# Patient Record
Sex: Female | Born: 1990 | Race: Black or African American | Hispanic: No | Marital: Single | Smoking: Former smoker
Health system: Southern US, Community
[De-identification: ages and names within clinical notes are randomized; demographics above are authoritative.]

## PROBLEM LIST (undated history)

## (undated) ENCOUNTER — Ambulatory Visit (HOSPITAL_COMMUNITY)
Discharge status: Absent without leave | Payer: PRIVATE HEALTH INSURANCE | Attending: Family Medicine | Admitting: Family Medicine

---
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
## 2014-04-27 ENCOUNTER — Encounter (HOSPITAL_COMMUNITY): Payer: Self-pay | Admitting: Emergency Medicine

## 2014-04-27 ENCOUNTER — Emergency Department (HOSPITAL_COMMUNITY)
Admission: EM | Admit: 2014-04-27 | Discharge: 2014-04-27 | Disposition: A | Discharge status: Home / Self Care | Payer: BC Managed Care – PPO | Attending: Emergency Medicine | Admitting: Emergency Medicine

## 2014-04-27 DIAGNOSIS — N390 Urinary tract infection, site not specified: Secondary | ICD-10-CM | POA: Insufficient documentation

## 2014-04-27 DIAGNOSIS — Z3202 Encounter for pregnancy test, result negative: Secondary | ICD-10-CM | POA: Insufficient documentation

## 2014-04-27 DIAGNOSIS — F17200 Nicotine dependence, unspecified, uncomplicated: Secondary | ICD-10-CM | POA: Insufficient documentation

## 2014-04-27 DIAGNOSIS — R110 Nausea: Secondary | ICD-10-CM | POA: Insufficient documentation

## 2014-04-27 LAB — URINALYSIS, ROUTINE W REFLEX MICROSCOPIC
BILIRUBIN URINE: NEGATIVE
Glucose, UA: NEGATIVE mg/dL
Hgb urine dipstick: NEGATIVE
KETONES UR: NEGATIVE mg/dL
Nitrite: POSITIVE — AB
PH: 7 (ref 5.0–8.0)
Protein, ur: NEGATIVE mg/dL
Specific Gravity, Urine: 1.025 (ref 1.005–1.030)
Urobilinogen, UA: 1 mg/dL (ref 0.0–1.0)

## 2014-04-27 LAB — URINE MICROSCOPIC-ADD ON

## 2014-04-27 LAB — PREGNANCY, URINE: Preg Test, Ur: NEGATIVE

## 2014-04-27 MED ORDER — CEPHALEXIN 500 MG PO CAPS: 500.0000 mg | ORAL_CAPSULE | Freq: Four times a day (QID) | ORAL | Status: DC

## 2014-04-27 NOTE — Discharge Instructions (Signed)
Take antibiotic to completion for your urinary tract infection.  Urinary Tract Infection Urinary tract infections (UTIs) can develop anywhere along your urinary tract. Your urinary tract is your body's drainage system for removing wastes and extra water. Your urinary tract includes two kidneys, two ureters, a bladder, and a urethra. Your kidneys are a pair of bean-shaped organs. Each kidney is about the size of your fist. They are located below your ribs, one on each side of your spine. CAUSES Infections are caused by microbes, which are microscopic organisms, including fungi, viruses, and bacteria. These organisms are so small that they can only be seen through a microscope. Bacteria are the microbes that most commonly cause UTIs. SYMPTOMS  Symptoms of UTIs may vary by age and gender of the patient and by the location of the infection. Symptoms in young women typically include a frequent and intense urge to urinate and a painful, burning feeling in the bladder or urethra during urination. Older women and men are more likely to be tired, shaky, and weak and have muscle aches and abdominal pain. A fever may mean the infection is in your kidneys. Other symptoms of a kidney infection include pain in your back or sides below the ribs, nausea, and vomiting. DIAGNOSIS To diagnose a UTI, your caregiver will ask you about your symptoms. Your caregiver also will ask to provide a urine sample. The urine sample will be tested for bacteria and white blood cells. White blood cells are made by your body to help fight infection. TREATMENT  Typically, UTIs can be treated with medication. Because most UTIs are caused by a bacterial infection, they usually can be treated with the use of antibiotics. The choice of antibiotic and length of treatment depend on your symptoms and the type of bacteria causing your infection. HOME CARE INSTRUCTIONS  If you were prescribed antibiotics, take them exactly as your caregiver  instructs you. Finish the medication even if you feel better after you have only taken some of the medication.  Drink enough water and fluids to keep your urine clear or pale yellow.  Avoid caffeine, tea, and carbonated beverages. They tend to irritate your bladder.  Empty your bladder often. Avoid holding urine for long periods of time.  Empty your bladder before and after sexual intercourse.  After a bowel movement, women should cleanse from front to back. Use each tissue only once. SEEK MEDICAL CARE IF:   You have back pain.  You develop a fever.  Your symptoms do not begin to resolve within 3 days. SEEK IMMEDIATE MEDICAL CARE IF:   You have severe back pain or lower abdominal pain.  You develop chills.  You have nausea or vomiting.  You have continued burning or discomfort with urination. MAKE SURE YOU:   Understand these instructions.  Will watch your condition.  Will get help right away if you are not doing well or get worse. Document Released: 08/31/2005 Document Revised: 05/22/2012 Document Reviewed: 12/30/2011 [facility] Patient Information 2014 [place], [place].

## 2014-04-27 NOTE — ED Notes (Addendum)
Pt A+ox4, reports foul smelling urine x2 months, has not seen a doctor. Reports taking "something over the counter to clear it out".  Pt reports onset today of R flank pain, 8/10.  Pt denies n/v/d/c.  Pt denies fevers/chills.  Skin PWD.  Ambulatory with steady gait.  NAD.

## 2014-04-27 NOTE — ED Provider Notes (Signed)
CSN: [id number]     Arrival date & time 04/27/14  2117 History   First MD Initiated Contact with Patient 04/27/14 2138     Chief Complaint  Patient presents with  . Flank Pain     (Consider location/radiation/quality/duration/timing/severity/associated sxs/prior Treatment) HPI Comments: 23 year old female presents to the emergency department complaining of foul-smelling urine x2 months. Patient states for the first month she had dysuria, foul-smelling urine, increased urinary frequency and urgency. She has been taking over-the-counter medications and drinking a lot of water with relief of the dysuria, frequency and urgency, however the bowel odor to her urine has remained. Denies vaginal discharge or bleeding. States she has suprapubic pain radiating to the right side of her back which developed over the past couple of days. Admits to associated nausea earlier this morning which has since subsided. Denies fever, chills, vomiting, diarrhea, hematuria.  Patient is a 23 y.o. female presenting with flank pain. The history is provided by the patient.  Flank Pain Associated symptoms include abdominal pain and nausea.    History reviewed. No pertinent past medical history. History reviewed. No pertinent past surgical history. No family history on file. History  Substance Use Topics  . Smoking status: Current Some Day Smoker  . Smokeless tobacco: Never Used  . Alcohol Use: Yes     Comment: occassionally   OB History   Grav Para Term Preterm Abortions TAB SAB Ect Mult Living                 Review of Systems  Gastrointestinal: Positive for nausea and abdominal pain.  Genitourinary: Positive for flank pain.       Positive for foul smelling urine.  All other systems reviewed and are negative.     Allergies  Review of patient's allergies indicates no known allergies.  Home Medications   Prior to Admission medications   Not on File   BP 123/79  Pulse 79  Temp(Src) 98.8 F (37.1  C)  Resp 16  SpO2 100%  LMP 04/15/2014 Physical Exam  Nursing note and vitals reviewed. Constitutional: She is oriented to person, place, and time. She appears well-developed and well-nourished. No distress.  HENT:  Head: Normocephalic and atraumatic.  Mouth/Throat: Oropharynx is clear and moist.  Eyes: Conjunctivae are normal.  Neck: Normal range of motion. Neck supple.  Cardiovascular: Normal rate, regular rhythm and normal heart sounds.   Pulmonary/Chest: Effort normal and breath sounds normal.  Abdominal: Soft. Bowel sounds are normal. There is tenderness (mild, discomfort) in the suprapubic area. There is no rigidity, no rebound, no guarding and no CVA tenderness.  No peritoneal signs.  Musculoskeletal: Normal range of motion. She exhibits no edema.  Neurological: She is alert and oriented to person, place, and time.  Skin: Skin is warm and dry. She is not diaphoretic.  Psychiatric: She has a normal mood and affect. Her behavior is normal.    ED Course  Procedures (including critical care time) Labs Review Labs Reviewed  URINALYSIS, ROUTINE W REFLEX MICROSCOPIC - Abnormal; Notable for the following:    Color, Urine ORANGE (*)    APPearance CLOUDY (*)    Nitrite POSITIVE (*)    Leukocytes, UA SMALL (*)    All other components within normal limits  URINE MICROSCOPIC-ADD ON - Abnormal; Notable for the following:    Bacteria, UA MANY (*)    All other components within normal limits  PREGNANCY, URINE    Imaging Review No results found.   EKG Interpretation None  MDM   Final diagnoses:  UTI (urinary tract infection)   Pt presenting with 2 months of foul odor urine she is well appearing and in NAD. Afebrile. Mild discomfort in suprapubic area. UA nitrite positive, many bacteria. Will treat with keflex. Stable for d/c. Return precautions given. Patient states understanding of treatment care plan and is agreeable.   Trevor Mace, PA-C 04/27/14 2233

## 2014-04-27 NOTE — ED Provider Notes (Signed)
Medical screening examination/treatment/procedure(s) were performed by non-physician practitioner and as supervising physician I was immediately available for consultation/collaboration.   EKG Interpretation None        Lyanne Co, MD 04/27/14 2242

## 2016-07-27 ENCOUNTER — Encounter (HOSPITAL_COMMUNITY): Payer: Self-pay | Admitting: Emergency Medicine

## 2016-07-27 ENCOUNTER — Emergency Department (HOSPITAL_COMMUNITY): Payer: BLUE CROSS/BLUE SHIELD

## 2016-07-27 ENCOUNTER — Emergency Department (HOSPITAL_COMMUNITY)
Admission: EM | Admit: 2016-07-27 | Discharge: 2016-07-27 | Disposition: A | Discharge status: Home / Self Care | Payer: BLUE CROSS/BLUE SHIELD | Attending: Emergency Medicine | Admitting: Emergency Medicine

## 2016-07-27 DIAGNOSIS — K219 Gastro-esophageal reflux disease without esophagitis: Secondary | ICD-10-CM | POA: Insufficient documentation

## 2016-07-27 DIAGNOSIS — R05 Cough: Secondary | ICD-10-CM | POA: Diagnosis present

## 2016-07-27 DIAGNOSIS — IMO0001 Reserved for inherently not codable concepts without codable children: Secondary | ICD-10-CM

## 2016-07-27 DIAGNOSIS — R059 Cough, unspecified: Secondary | ICD-10-CM

## 2016-07-27 DIAGNOSIS — F17200 Nicotine dependence, unspecified, uncomplicated: Secondary | ICD-10-CM | POA: Diagnosis not present

## 2016-07-27 MED ORDER — OMEPRAZOLE 20 MG PO CPDR: 20.0000 mg | DELAYED_RELEASE_CAPSULE | Freq: Every day | ORAL | 0 refills | Status: DC

## 2016-07-27 MED ORDER — GI COCKTAIL ~~LOC~~
30.0000 mL | Freq: Once | ORAL | Status: AC
  Administered 2016-07-27: 30 mL via ORAL
  Filled 2016-07-27: qty 30

## 2016-07-27 NOTE — ED Provider Notes (Signed)
[facility]-EMERGENCY DEPT Provider Note   CSN: [id number] Arrival date & time: 07/27/16  0152     History   Chief Complaint Chief Complaint  Patient presents with  . Cough    dry throat    HPI Karina Gray is a 25 y.o. female.  Patient presents with persistent, recurring cough for the past one month. No fever, chest pain, vomiting, congestion. She is a smoker, no history of asthma. She does not feel short of breath or have pleuritic pain.    The history is provided by the patient. No language interpreter was used.  Cough  This is a new problem. The current episode started more than 1 week ago. The problem occurs every few minutes. The cough is non-productive. Pertinent negatives include no chest pain, no rhinorrhea and no sore throat.    History reviewed. No pertinent past medical history.  There are no active problems to display for this patient.   History reviewed. No pertinent surgical history.  OB History    No data available       Home Medications    Prior to Admission medications   Medication Sig Start Date End Date Taking? Authorizing Provider  acetaminophen (TYLENOL) 500 MG tablet Take 500 mg by mouth every 6 (six) hours as needed (headache).    Historical Provider, MD  cephALEXin (KEFLEX) 500 MG capsule Take 1 capsule (500 mg total) by mouth 4 (four) times daily. 04/27/14   Kathrynn Speedobyn M Hess, PA-C    Family History No family history on file.  Social History Social History  Substance Use Topics  . Smoking status: Current Some Day Smoker  . Smokeless tobacco: Never Used  . Alcohol use Yes     Comment: occassionally     Allergies   Review of patient's allergies indicates no known allergies.   Review of Systems Review of Systems  Constitutional: Negative for fever.  HENT: Negative for congestion, rhinorrhea, sinus pressure and sore throat.   Respiratory: Positive for cough.   Cardiovascular: Negative for chest pain.  Gastrointestinal: Negative for  abdominal pain and nausea.  Musculoskeletal: Negative for back pain.     Physical Exam Updated Vital Signs BP 115/78 (BP Location: Left Arm)   Pulse 96   Temp 98 F (36.7 C) (Oral)   Resp 18   Ht 4\' 11"  (1.499 m)   Wt 83.9 kg   LMP 06/26/2016   SpO2 100%   BMI 37.37 kg/m   Physical Exam  Constitutional: She appears well-developed and well-nourished.  HENT:  Head: Normocephalic.  Neck: Normal range of motion. Neck supple.  Cardiovascular: Normal rate and regular rhythm.   Pulmonary/Chest: Effort normal and breath sounds normal.  Abdominal: Soft. Bowel sounds are normal. There is no tenderness. There is no rebound and no guarding.  Musculoskeletal: Normal range of motion.  Neurological: She is alert. No cranial nerve deficit.  Skin: Skin is warm and dry. No rash noted.  Psychiatric: She has a normal mood and affect.     ED Treatments / Results  Labs (all labs ordered are listed, but only abnormal results are displayed) Labs Reviewed - No data to display  EKG  EKG Interpretation None       Radiology Dg Chest 2 View  Result Date: 07/27/2016 CLINICAL DATA:  25 y/o F; chronic cough with chest congestion for 1 month. EXAM: CHEST  2 VIEW COMPARISON:  None. FINDINGS: Peribronchial thickening probably represents bronchitis. No consolidation, pneumothorax, or pleural effusion. No acute osseous abnormality. IMPRESSION:  Peribronchial thickening probably represents bronchitis. No consolidation, pneumothorax, or pleural effusion. Electronically Signed   By: Mitzi HansenLance  Furusawa-Stratton M.D.   On: 07/27/2016 02:41    Procedures Procedures (including critical care time)  Medications Ordered in ED Medications  gi cocktail (Maalox,Lidocaine,Donnatal) (not administered)     Initial Impression / Assessment and Plan / ED Course  I have reviewed the triage vital signs and the nursing notes.  Pertinent labs & imaging results that were available during my care of the patient were  reviewed by me and considered in my medical decision making (see chart for details).  Clinical Course    Patient presents for persistent, recurrent dry cough x 1 month. CXR clear, afebrile, no SOB. She admits to intermittent dyspepsia. Suspect cough is related to this rather than pulmonary cause. GI cocktail provided with perceived improvement in symptoms of cough. Will refer to PCP, start on Prilosec.   Final Clinical Impressions(s) / ED Diagnoses   Final diagnoses:  None   1. Cough 2. Reflux  New Prescriptions New Prescriptions   No medications on file     Elpidio AnisShari Maureen Duesing, PA-C 07/27/16 [id number]    Layla MawKristen N Ward, DO 07/27/16 [phone]

## 2016-07-27 NOTE — ED Triage Notes (Signed)
Pt. reports chronic dry cough , throat irritation with mild chest congestion , denies fever or chills. Respirations unlabored .

## 2016-09-11 ENCOUNTER — Encounter (HOSPITAL_COMMUNITY): Payer: Self-pay | Admitting: *Deleted

## 2016-09-11 ENCOUNTER — Emergency Department (HOSPITAL_COMMUNITY)
Admission: EM | Admit: 2016-09-11 | Discharge: 2016-09-11 | Disposition: A | Discharge status: Home / Self Care | Payer: BLUE CROSS/BLUE SHIELD | Attending: Emergency Medicine | Admitting: Emergency Medicine

## 2016-09-11 ENCOUNTER — Emergency Department (HOSPITAL_COMMUNITY): Payer: BLUE CROSS/BLUE SHIELD

## 2016-09-11 DIAGNOSIS — Y92410 Unspecified street and highway as the place of occurrence of the external cause: Secondary | ICD-10-CM | POA: Diagnosis not present

## 2016-09-11 DIAGNOSIS — Y999 Unspecified external cause status: Secondary | ICD-10-CM | POA: Diagnosis not present

## 2016-09-11 DIAGNOSIS — F17200 Nicotine dependence, unspecified, uncomplicated: Secondary | ICD-10-CM | POA: Insufficient documentation

## 2016-09-11 DIAGNOSIS — Y939 Activity, unspecified: Secondary | ICD-10-CM | POA: Insufficient documentation

## 2016-09-11 DIAGNOSIS — T2017XA Burn of first degree of neck, initial encounter: Secondary | ICD-10-CM | POA: Insufficient documentation

## 2016-09-11 DIAGNOSIS — S0990XA Unspecified injury of head, initial encounter: Secondary | ICD-10-CM | POA: Diagnosis present

## 2016-09-11 DIAGNOSIS — S0181XA Laceration without foreign body of other part of head, initial encounter: Secondary | ICD-10-CM | POA: Diagnosis not present

## 2016-09-11 MED ORDER — CYCLOBENZAPRINE HCL 10 MG PO TABS: 5.0000 mg | ORAL_TABLET | Freq: Two times a day (BID) | ORAL | 0 refills | Status: DC | PRN

## 2016-09-11 MED ORDER — IBUPROFEN 600 MG PO TABS: 600.0000 mg | ORAL_TABLET | Freq: Four times a day (QID) | ORAL | 0 refills | Status: DC | PRN

## 2016-09-11 MED ORDER — SILVER SULFADIAZINE 1 % EX CREA: 1.0000 "application " | TOPICAL_CREAM | Freq: Every day | CUTANEOUS | 0 refills | Status: DC
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
## 2016-09-11 NOTE — ED Notes (Signed)
See pa assessment 

## 2016-09-11 NOTE — ED Notes (Signed)
Patient transported to CT at this time via ED stretcher. Pt in no apparent distress at this time.   
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
## 2019-03-28 ENCOUNTER — Encounter (HOSPITAL_COMMUNITY): Payer: Self-pay

## 2019-03-28 ENCOUNTER — Emergency Department (HOSPITAL_COMMUNITY)
Admission: EM | Admit: 2019-03-28 | Discharge: 2019-03-29 | Disposition: A | Discharge status: Home / Self Care | Payer: BLUE CROSS/BLUE SHIELD | Attending: Emergency Medicine | Admitting: Emergency Medicine

## 2019-03-28 ENCOUNTER — Other Ambulatory Visit: Payer: Self-pay

## 2019-03-28 DIAGNOSIS — B373 Candidiasis of vulva and vagina: Secondary | ICD-10-CM | POA: Insufficient documentation

## 2019-03-28 DIAGNOSIS — B3731 Acute candidiasis of vulva and vagina: Secondary | ICD-10-CM

## 2019-03-28 DIAGNOSIS — F17210 Nicotine dependence, cigarettes, uncomplicated: Secondary | ICD-10-CM | POA: Insufficient documentation

## 2019-03-28 DIAGNOSIS — L292 Pruritus vulvae: Secondary | ICD-10-CM | POA: Diagnosis present

## 2019-03-28 NOTE — ED Provider Notes (Signed)
[facility]   CSN: [id number] Arrival date & time: 03/28/19  2249    History   Chief Complaint Chief Complaint  Patient presents with  . Vaginal Itching    HPI Karina Gray is a 28 y.o. female.     Patient presents to the ED with a chief complaint of vaginal itching and discharge.  She states that it has been ongoing for the past 3 days.  She states that she has also had some burning with urination.  She is afraid she has a yeast infection.  She states that she believes she has an appointment with her OBGYN coming up.  The history is provided by the patient. No language interpreter was used.    History reviewed. No pertinent past medical history.  There are no active problems to display for this patient.   Past Surgical History:  Procedure Laterality Date  . KNEE SURGERY Bilateral 2006     OB History   No obstetric history on file.      Home Medications    Prior to Admission medications   Medication Sig Start Date End Date Taking? Authorizing Provider  acetaminophen (TYLENOL) 500 MG tablet Take 500 mg by mouth every 6 (six) hours as needed (headache).    [provider]  cephALEXin (KEFLEX) 500 MG capsule Take 1 capsule (500 mg total) by mouth 4 (four) times daily. 04/27/14   Hess, Nada Boozerobyn M, PA-C  cyclobenzaprine (FLEXERIL) 10 MG tablet Take 0.5-1 tablets (5-10 mg total) by mouth 2 (two) times daily as needed. 09/11/16   Marlon PelGreene, Tiffany, PA-C  ibuprofen (ADVIL,MOTRIN) 600 MG tablet Take 1 tablet (600 mg total) by mouth every 6 (six) hours as needed. 09/11/16   Marlon PelGreene, Tiffany, PA-C  omeprazole (PRILOSEC) 20 MG capsule Take 1 capsule (20 mg total) by mouth daily. 07/27/16   Elpidio AnisUpstill, Shari, PA-C  silver sulfADIAZINE (SILVADENE) 1 % cream Apply 1 application topically daily. 09/11/16   Marlon PelGreene, Tiffany, PA-C    Family History No family history on file.  Social History Social History   Tobacco Use  .  Smoking status: Current Some Day Smoker  . Smokeless tobacco: Never Used  Substance Use Topics  . Alcohol use: Yes    Comment: occassionally  . Drug use: Not on file     Allergies   Patient has no known allergies.   Review of Systems Review of Systems  All other systems reviewed and are negative.    Physical Exam Updated Vital Signs BP (!) 143/83   Pulse 99   Temp 98.8 F (37.1 C) (Oral)   Resp 18   SpO2 99%   Physical Exam Vitals signs and nursing note reviewed.  Constitutional:      General: She is not in acute distress.    Appearance: She is well-developed.  HENT:     Head: Normocephalic and atraumatic.  Eyes:     Conjunctiva/sclera: Conjunctivae normal.  Neck:     Musculoskeletal: Neck supple.  Cardiovascular:     Rate and Rhythm: Normal rate and regular rhythm.     Heart sounds: No murmur.  Pulmonary:     Effort: Pulmonary effort is normal. No respiratory distress.     Breath sounds: Normal breath sounds.  Abdominal:     Palpations: Abdomen is soft.     Tenderness: There is no abdominal tenderness.  Skin:    General: Skin is warm and dry.  Neurological:     Mental Status: She  is alert.      ED Treatments / Results  Labs (all labs ordered are listed, but only abnormal results are displayed) Labs Reviewed - No data to display  EKG None  Radiology No results found.  Procedures Procedures (including critical care time)  Medications Ordered in ED Medications - No data to display   Initial Impression / Assessment and Plan / ED Course  I have reviewed the triage vital signs and the nursing notes.  Pertinent labs & imaging results that were available during my care of the patient were reviewed by me and considered in my medical decision making (see chart for details).        Patient with vaginal discharge.  No abdominal pain.  Has some burning and itching.  Self swab shows yeast cells.  UA inconsistent with UTI. preg negative.  Final  Clinical Impressions(s) / ED Diagnoses   Final diagnoses:  Yeast vaginitis    ED Discharge Orders         Ordered    fluconazole (DIFLUCAN) 150 MG tablet     03/29/19 0033           Roxy Horseman, PA-C 03/29/19 0404    Mesner, Barbara Cower, MD 03/29/19 [phone]

## 2019-03-28 NOTE — ED Triage Notes (Signed)
Pt reports vaginal irritation and burning with urination for the past 3 days. Also reports some white discharge

## 2019-03-29 LAB — GC/CHLAMYDIA PROBE AMP (~~LOC~~) NOT AT ARMC
Chlamydia: NEGATIVE
Neisseria Gonorrhea: NEGATIVE

## 2019-03-29 LAB — URINALYSIS, ROUTINE W REFLEX MICROSCOPIC
Bacteria, UA: NONE SEEN
Bilirubin Urine: NEGATIVE
Glucose, UA: NEGATIVE mg/dL
Ketones, ur: NEGATIVE mg/dL
Leukocytes,Ua: NEGATIVE
Nitrite: NEGATIVE
Protein, ur: NEGATIVE mg/dL
Specific Gravity, Urine: 1.028 (ref 1.005–1.030)
pH: 5 (ref 5.0–8.0)

## 2019-03-29 LAB — WET PREP, GENITAL
Clue Cells Wet Prep HPF POC: NONE SEEN
Sperm: NONE SEEN
Trich, Wet Prep: NONE SEEN

## 2019-03-29 LAB — PREGNANCY, URINE: Preg Test, Ur: NEGATIVE

## 2019-03-29 MED ORDER — FLUCONAZOLE 150 MG PO TABS
150.0000 mg | ORAL_TABLET | Freq: Once | ORAL | Status: AC
  Administered 2019-03-29: 150 mg via ORAL
  Filled 2019-03-29: qty 1

## 2019-03-29 MED ORDER — FLUCONAZOLE 150 MG PO TABS: ORAL_TABLET | ORAL | 0 refills | Status: DC

## 2019-03-29 NOTE — ED Notes (Signed)
Patient verbalizes understanding of discharge instructions. Opportunity for questioning and answers were provided. Armband removed by staff, pt discharged from ED.  

## 2019-11-20 ENCOUNTER — Ambulatory Visit (HOSPITAL_COMMUNITY)
Admission: EM | Admit: 2019-11-20 | Discharge: 2019-11-20 | Disposition: A | Discharge status: Home / Self Care | Payer: Self-pay | Attending: Family Medicine | Admitting: Family Medicine

## 2019-11-20 ENCOUNTER — Encounter (HOSPITAL_COMMUNITY): Payer: Self-pay

## 2019-11-20 DIAGNOSIS — N898 Other specified noninflammatory disorders of vagina: Secondary | ICD-10-CM | POA: Insufficient documentation

## 2019-11-20 LAB — POCT URINALYSIS DIP (DEVICE)
Bilirubin Urine: NEGATIVE
Glucose, UA: NEGATIVE mg/dL
Ketones, ur: NEGATIVE mg/dL
Nitrite: NEGATIVE
Protein, ur: NEGATIVE mg/dL
Specific Gravity, Urine: >=1.03 (ref 1.005–1.030)
Urobilinogen, UA: 0.2 mg/dL (ref 0.0–1.0)
pH: 7 (ref 5.0–8.0)

## 2019-11-20 MED ORDER — CEFTRIAXONE SODIUM 250 MG IJ SOLR
250.0000 mg | Freq: Once | INTRAMUSCULAR | Status: AC
  Administered 2019-11-20: 250 mg via INTRAMUSCULAR

## 2019-11-20 MED ORDER — AZITHROMYCIN 250 MG PO TABS
ORAL_TABLET | ORAL | Status: AC
  Filled 2019-11-20: qty 4

## 2019-11-20 MED ORDER — AZITHROMYCIN 250 MG PO TABS
1000.0000 mg | ORAL_TABLET | Freq: Once | ORAL | Status: AC
  Administered 2019-11-20: 1000 mg via ORAL

## 2019-11-20 MED ORDER — FLUCONAZOLE 150 MG PO TABS: ORAL_TABLET | ORAL | 0 refills | Status: DC

## 2019-11-20 MED ORDER — CEFTRIAXONE SODIUM 250 MG IJ SOLR
INTRAMUSCULAR | Status: AC
  Filled 2019-11-20: qty 250

## 2019-11-20 MED ORDER — LIDOCAINE HCL (PF) 1 % IJ SOLN
INTRAMUSCULAR | Status: AC
  Filled 2019-11-20: qty 2

## 2019-11-20 NOTE — ED Triage Notes (Signed)
Pt presents with vaginal itchiness and burning when urinating x 2 days.

## 2019-11-20 NOTE — ED Provider Notes (Signed)
[facility]   [id number] 11/20/19 Arrival Time: 9381  ASSESSMENT & PLAN:  1. Vaginal itching     No s/s of PID.  Meds ordered this encounter  Medications  . fluconazole (DIFLUCAN) 150 MG tablet    Sig: Take one tablet by mouth as a single dose. May repeat in 3 days if symptoms persist.    Dispense:  2 tablet    Refill:  0  . azithromycin (ZITHROMAX) tablet 1,000 mg  . cefTRIAXone (ROCEPHIN) injection 250 mg      Discharge Instructions     You have been given the following medications today for treatment of suspected gonorrhea and/or chlamydia:  cefTRIAXone (ROCEPHIN) injection 250 mg azithromycin (ZITHROMAX) tablet 1,000 mg  Even though we have treated you today, we have sent testing for sexually transmitted infections as well as a urine culture. We will notify you of any positive results once they are received. If required, we will prescribe any medications you might need.  Please refrain from all sexual activity for at least the next seven days.     Pending: Labs Reviewed  URINE CULTURE  CERVICOVAGINAL ANCILLARY ONLY    Will notify of any positive results. Instructed to refrain from sexual activity for at least seven days.  Reviewed expectations re: course of current medical issues. Questions answered. Outlined signs and symptoms indicating need for more acute intervention. Patient verbalized understanding. After Visit Summary given.   SUBJECTIVE:  Karina Gray is a 28 y.o. female who presents with complaint of vaginal itching/irritation. Onset gradual. First noticed approx 2 d ago. Questions slight vaginal discharge; "see a few spots". No specific aggravating or alleviating factors reported. Denies: urinary frequency and gross hematuria. No specific dysuria. Afebrile. No abdominal or pelvic pain. Normal PO intake wihout n/v. No genital rashes or lesions. Reports that she is sexually active with single female partner. OTC treatment:  none. History of STI: none reported.  No LMP recorded (within weeks).  ROS: As per HPI. All other systems negative.   OBJECTIVE:  Vitals:   11/20/19 1256  BP: 114/78  Pulse: 86  Resp: 16  Temp: 98.9 F (37.2 C)  TempSrc: Oral  SpO2: 100%     General appearance: alert, cooperative, appears stated age and no distress Throat: lips, mucosa, and tongue normal; teeth and gums normal CV: RRR Lungs: CTAB Back: no CVA tenderness; FROM at waist Abdomen: soft, non-tender GU: deferred Skin: warm and dry Psychological: alert and cooperative; normal mood and affect.    Labs Reviewed  URINE CULTURE  CERVICOVAGINAL ANCILLARY ONLY    No Known Allergies   PMH: "Healthy" FH: HTN Social History   Socioeconomic History  . Marital status: Single    Spouse name: Not on file  . Number of children: Not on file  . Years of education: Not on file  . Highest education level: Not on file  Occupational History  . Not on file  Tobacco Use  . Smoking status: Current Some Day Smoker  . Smokeless tobacco: Never Used  Substance and Sexual Activity  . Alcohol use: Yes    Comment: occassionally  . Drug use: Not on file  . Sexual activity: Yes    Birth control/protection: Condom  Other Topics Concern  . Not on file  Social History Narrative  . Not on file   Social Determinants of Health   Financial Resource Strain:   . Difficulty of Paying Living Expenses: Not on file  Food Insecurity:   . Worried About  Running Out of Food in the Last Year: Not on file  . Ran Out of Food in the Last Year: Not on file  Transportation Needs:   . Lack of Transportation (Medical): Not on file  . Lack of Transportation (Non-Medical): Not on file  Physical Activity:   . Days of Exercise per Week: Not on file  . Minutes of Exercise per Session: Not on file  Stress:   . Feeling of Stress : Not on file  Social Connections:   . Frequency of Communication with Friends and Family: Not on file  .  Frequency of Social Gatherings with Friends and Family: Not on file  . Attends Religious Services: Not on file  . Active Member of Clubs or Organizations: Not on file  . Attends Banker Meetings: Not on file  . Marital Status: Not on file  Intimate Partner Violence:   . Fear of Current or Ex-Partner: Not on file  . Emotionally Abused: Not on file  . Physically Abused: Not on file  . Sexually Abused: Not on file          Mardella Layman, MD 11/20/19 [phone]

## 2019-11-20 NOTE — ED Notes (Signed)
Urine in lab, instructions for self swab discussed with patient

## 2019-11-20 NOTE — Discharge Instructions (Addendum)
You have been given the following medications today for treatment of suspected gonorrhea and/or chlamydia:  cefTRIAXone (ROCEPHIN) injection 250 mg azithromycin (ZITHROMAX) tablet 1,000 mg  Even though we have treated you today, we have sent testing for sexually transmitted infections as well as a urine culture. We will notify you of any positive results once they are received. If required, we will prescribe any medications you might need.  Please refrain from all sexual activity for at least the next seven days.

## 2019-11-21 LAB — URINE CULTURE: Culture: NO GROWTH

## 2019-11-22 LAB — CERVICOVAGINAL ANCILLARY ONLY
Bacterial vaginitis: NEGATIVE
Candida vaginitis: NEGATIVE
Chlamydia: NEGATIVE
Neisseria Gonorrhea: NEGATIVE
Trichomonas: NEGATIVE

## 2020-09-01 ENCOUNTER — Other Ambulatory Visit: Payer: Self-pay

## 2020-09-01 ENCOUNTER — Ambulatory Visit
Admission: EM | Admit: 2020-09-01 | Discharge: 2020-09-01 | Disposition: A | Discharge status: Home / Self Care | Payer: PRIVATE HEALTH INSURANCE | Attending: Physician Assistant | Admitting: Physician Assistant

## 2020-09-01 DIAGNOSIS — H0102A Squamous blepharitis right eye, upper and lower eyelids: Secondary | ICD-10-CM

## 2020-09-01 DIAGNOSIS — H1032 Unspecified acute conjunctivitis, left eye: Secondary | ICD-10-CM | POA: Diagnosis not present

## 2020-09-01 MED ORDER — OFLOXACIN 0.3 % OP SOLN: 1.0000 [drp] | Freq: Four times a day (QID) | OPHTHALMIC | 0 refills | Status: AC

## 2020-09-01 NOTE — ED Provider Notes (Signed)
[facility]    CSN: [id number] Arrival date & time: 09/01/20  1626      History   Chief Complaint Chief Complaint  Patient presents with  . Eye Pain    HPI Karina Gray is a 29 y.o. female.   29 year old female comes in for 2 day history of left eye redness, irritation, swelling. This started as bilateral eye itching. Since then, left eyelids have had swelling/irritation. Has noticed drainage to the eye, though without crusting. Intermittent vision changes. Mild cough, rhinorrhea, sneezing for the past week. Denies fevers. No contact lens, glasses use.      History reviewed. No pertinent past medical history.  There are no problems to display for this patient.   Past Surgical History:  Procedure Laterality Date  . KNEE SURGERY Bilateral 2006    OB History   No obstetric history on file.      Home Medications    Prior to Admission medications   Medication Sig Start Date End Date Taking? Authorizing Provider  ofloxacin (OCUFLOX) 0.3 % ophthalmic solution Place 1 drop into the left eye 4 (four) times daily for 5 days. 09/01/20 09/06/20  Belinda Fisher, PA-C    Family History History reviewed. No pertinent family history.  Social History Social History   Tobacco Use  . Smoking status: Current Some Day Smoker  . Smokeless tobacco: Never Used  Substance Use Topics  . Alcohol use: Yes    Comment: occassionally  . Drug use: Yes    Types: Marijuana     Allergies   Patient has no known allergies.   Review of Systems Review of Systems  Reason unable to perform ROS: See HPI as above.     Physical Exam Triage Vital Signs ED Triage Vitals  Enc Vitals Group     BP 09/01/20 1842 113/79     Pulse Rate 09/01/20 1842 66     Resp 09/01/20 1842 16     Temp 09/01/20 1842 98.3 F (36.8 C)     Temp Source 09/01/20 1842 Oral     SpO2 09/01/20 1842 100 %     Weight --      Height --      Head Circumference --      Peak Flow --      Pain Score  09/01/20 1852 2     Pain Loc --      Pain Edu? --      Excl. in GC? --    No data found.  Updated Vital Signs BP 113/79 (BP Location: Left Arm)   Pulse 66   Temp 98.3 F (36.8 C) (Oral)   Resp 16   LMP 08/18/2020   SpO2 100%   Visual Acuity Right Eye Distance: 20/20 Left Eye Distance: 20/30 Bilateral Distance: 20/20  Right Eye Near:   Left Eye Near:    Bilateral Near:     Physical Exam Constitutional:      General: She is not in acute distress.    Appearance: She is well-developed. She is not ill-appearing, toxic-appearing or diaphoretic.  HENT:     Head: Normocephalic and atraumatic.  Eyes:     Pupils: Pupils are equal, round, and reactive to light.     Comments: Left upper eyelid swelling without erythema, warmth. No tenderness to palpation. Left conjunctival injection. Right conjunctiva clear. EOMi. PERRL  Neurological:     Mental Status: She is alert and oriented to person, place, and time.  UC Treatments / Results  Labs (all labs ordered are listed, but only abnormal results are displayed) Labs Reviewed - No data to display  EKG   Radiology No results found.  Procedures Procedures (including critical care time)  Medications Ordered in UC Medications - No data to display  Initial Impression / Assessment and Plan / UC Course  I have reviewed the triage vital signs and the nursing notes.  Pertinent labs & imaging results that were available during my care of the patient were reviewed by me and considered in my medical decision making (see chart for details).    Start ofloxacin drops as directed. Lid scrubs and warm compresses as directed. Patient to follow up with ophthalmology if symptoms worsens or does not improve. Return precautions given.   Final Clinical Impressions(s) / UC Diagnoses   Final diagnoses:  Acute conjunctivitis of left eye, unspecified acute conjunctivitis type  Squamous blepharitis of upper and lower eyelids of both eyes      ED Prescriptions    Medication Sig Dispense Auth. Provider   ofloxacin (OCUFLOX) 0.3 % ophthalmic solution Place 1 drop into the left eye 4 (four) times daily for 5 days. 1 mL Belinda Fisher, PA-C     PDMP not reviewed this encounter.   Belinda Fisher, PA-C 09/01/20 1921

## 2020-09-01 NOTE — ED Triage Notes (Signed)
Pt c/o lt eye redness, swelling, irritation, and drainage since yesterday.

## 2020-09-01 NOTE — Discharge Instructions (Signed)
Use ofloxacin eyedrops as directed on left eye to cover for pink eye. Lid scrubs and warm compresses as directed. Monitor for any worsening of symptoms, changes in vision, sensitivity to light, eye swelling, painful eye movement, follow up with ophthalmology for further evaluation.

## 2021-03-22 ENCOUNTER — Emergency Department (HOSPITAL_COMMUNITY)
Admission: EM | Admit: 2021-03-22 | Discharge: 2021-03-23 | Disposition: A | Discharge status: Home / Self Care | Payer: Self-pay | Attending: Emergency Medicine | Admitting: Emergency Medicine

## 2021-03-22 ENCOUNTER — Other Ambulatory Visit: Payer: Self-pay

## 2021-03-22 ENCOUNTER — Encounter (HOSPITAL_COMMUNITY): Payer: Self-pay

## 2021-03-22 ENCOUNTER — Emergency Department (HOSPITAL_COMMUNITY): Payer: Self-pay

## 2021-03-22 DIAGNOSIS — D649 Anemia, unspecified: Secondary | ICD-10-CM

## 2021-03-22 DIAGNOSIS — J1089 Influenza due to other identified influenza virus with other manifestations: Secondary | ICD-10-CM | POA: Insufficient documentation

## 2021-03-22 DIAGNOSIS — J111 Influenza due to unidentified influenza virus with other respiratory manifestations: Secondary | ICD-10-CM

## 2021-03-22 DIAGNOSIS — Z20822 Contact with and (suspected) exposure to covid-19: Secondary | ICD-10-CM | POA: Insufficient documentation

## 2021-03-22 DIAGNOSIS — F17200 Nicotine dependence, unspecified, uncomplicated: Secondary | ICD-10-CM | POA: Insufficient documentation

## 2021-03-22 LAB — CBC WITH DIFFERENTIAL/PLATELET
Abs Immature Granulocytes: 0.02 10*3/uL (ref 0.00–0.07)
Basophils Absolute: 0 10*3/uL (ref 0.0–0.1)
Basophils Relative: 1 %
Eosinophils Absolute: 0.1 10*3/uL (ref 0.0–0.5)
Eosinophils Relative: 1 %
HCT: 31.6 % — ABNORMAL LOW (ref 36.0–46.0)
Hemoglobin: 9.6 g/dL — ABNORMAL LOW (ref 12.0–15.0)
Immature Granulocytes: 0 %
Lymphocytes Relative: 6 %
Lymphs Abs: 0.3 10*3/uL — ABNORMAL LOW (ref 0.7–4.0)
MCH: 21.8 pg — ABNORMAL LOW (ref 26.0–34.0)
MCHC: 30.4 g/dL (ref 30.0–36.0)
MCV: 71.8 fL — ABNORMAL LOW (ref 80.0–100.0)
Monocytes Absolute: 0.8 10*3/uL (ref 0.1–1.0)
Monocytes Relative: 14 %
Neutro Abs: 4.5 10*3/uL (ref 1.7–7.7)
Neutrophils Relative %: 78 %
Platelets: 406 10*3/uL — ABNORMAL HIGH (ref 150–400)
RBC: 4.4 MIL/uL (ref 3.87–5.11)
RDW: 20 % — ABNORMAL HIGH (ref 11.5–15.5)
WBC: 5.7 10*3/uL (ref 4.0–10.5)
nRBC: 0 % (ref 0.0–0.2)

## 2021-03-22 LAB — RESP PANEL BY RT-PCR (FLU A&B, COVID) ARPGX2
Influenza A by PCR: POSITIVE — AB
Influenza B by PCR: NEGATIVE
SARS Coronavirus 2 by RT PCR: NEGATIVE

## 2021-03-22 LAB — URINALYSIS, ROUTINE W REFLEX MICROSCOPIC
Bilirubin Urine: NEGATIVE
Glucose, UA: NEGATIVE mg/dL
Ketones, ur: 5 mg/dL — AB
Leukocytes,Ua: NEGATIVE
Nitrite: NEGATIVE
Protein, ur: NEGATIVE mg/dL
Specific Gravity, Urine: 1.004 — ABNORMAL LOW (ref 1.005–1.030)
pH: 6 (ref 5.0–8.0)

## 2021-03-22 LAB — COMPREHENSIVE METABOLIC PANEL
ALT: 12 U/L (ref 0–44)
AST: 17 U/L (ref 15–41)
Albumin: 4 g/dL (ref 3.5–5.0)
Alkaline Phosphatase: 75 U/L (ref 38–126)
Anion gap: 8 (ref 5–15)
BUN: 7 mg/dL (ref 6–20)
CO2: 23 mmol/L (ref 22–32)
Calcium: 8.8 mg/dL — ABNORMAL LOW (ref 8.9–10.3)
Chloride: 109 mmol/L (ref 98–111)
Creatinine, Ser: 0.7 mg/dL (ref 0.44–1.00)
GFR, Estimated: >60 mL/min (ref >60)
Glucose, Bld: 84 mg/dL (ref 70–99)
Potassium: 3.3 mmol/L — ABNORMAL LOW (ref 3.5–5.1)
Sodium: 140 mmol/L (ref 135–145)
Total Bilirubin: 0.5 mg/dL (ref 0.3–1.2)
Total Protein: 7.7 g/dL (ref 6.5–8.1)

## 2021-03-22 LAB — LIPASE, BLOOD: Lipase: 25 U/L (ref 11–51)

## 2021-03-22 LAB — HCG, QUANTITATIVE, PREGNANCY: hCG, Beta Chain, Quant, S: <1 m[IU]/mL (ref <5)

## 2021-03-22 MED ORDER — SODIUM CHLORIDE 0.9 % IV BOLUS (SEPSIS)
1000.0000 mL | Freq: Once | INTRAVENOUS | Status: AC
  Administered 2021-03-22: 1000 mL via INTRAVENOUS

## 2021-03-22 MED ORDER — SODIUM CHLORIDE 0.9 % IV SOLN
1000.0000 mL | INTRAVENOUS | Status: DC
  Administered 2021-03-22: 1000 mL via INTRAVENOUS

## 2021-03-22 MED ORDER — ACETAMINOPHEN 325 MG PO TABS
650.0000 mg | ORAL_TABLET | Freq: Once | ORAL | Status: AC | PRN
Start: 2021-03-22 — End: 2021-03-22
  Administered 2021-03-22: 650 mg via ORAL
  Filled 2021-03-22: qty 2

## 2021-03-22 NOTE — ED Provider Notes (Signed)
MSE was initiated and I personally evaluated the patient and placed orders (if any) at  7:10 PM on March 22, 2021.  The patient appears stable so that the remainder of the MSE may be completed by another provider.    Patient with cough vomiting and fever for the last 24 hours.  Patient is nontoxic with mild epigastric tenderness.  Labs and x-rays ordered   Bethann Berkshire, MD 03/22/21 1910

## 2021-03-22 NOTE — ED Triage Notes (Signed)
Patient c/o mid abdominal pain, emesis, and cough since this morning.

## 2021-03-22 NOTE — ED Provider Notes (Signed)
[facility]-EMERGENCY DEPT Provider Note   CSN: [id number] Arrival date & time: 03/22/21  1840     History Chief Complaint  Patient presents with  . Abdominal Pain  . Cough  . Emesis    Karina Gray is a 30 y.o. female.  HPI   Pt presents to the ED with complaints of cough, vomiting, fevers headache body aches.  Patient symptoms started within the last day.  She has been coughing and is having posttussive emesis.  She had headache but denies sore throat.  No abdominal pain.  No diarrhea.  Patient has been vaccinated for COVID  History reviewed. No pertinent past medical history.  There are no problems to display for this patient.   Past Surgical History:  Procedure Laterality Date  . KNEE SURGERY Bilateral 2006     OB History   No obstetric history on file.     Family History  Problem Relation Age of Onset  . Heart attack Mother   . Diabetes Father     Social History   Tobacco Use  . Smoking status: Current Some Day Smoker  . Smokeless tobacco: Never Used  Vaping Use  . Vaping Use: Never used  Substance Use Topics  . Alcohol use: Yes    Comment: occassionally  . Drug use: Yes    Types: Marijuana    Home Medications Prior to Admission medications   Medication Sig Start Date End Date Taking? Authorizing Provider  oseltamivir (TAMIFLU) 75 MG capsule Take 1 capsule (75 mg total) by mouth 2 (two) times daily. 03/23/21  Yes Linwood Dibbles, MD    Allergies    Patient has no known allergies.  Review of Systems   Review of Systems  All other systems reviewed and are negative.   Physical Exam Updated Vital Signs BP 108/71   Pulse 79   Temp 99.3 F (37.4 C) (Oral)   Resp 17   Ht 1.524 m (5')   Wt 69.6 kg   LMP 03/15/2021 Comment: neg preg test  SpO2 100%   BMI 29.96 kg/m   Physical Exam Vitals and nursing note reviewed.  Constitutional:      General: She is not in acute distress.    Appearance: She is well-developed.   HENT:     Head: Normocephalic and atraumatic.     Right Ear: External ear normal.     Left Ear: External ear normal.  Eyes:     General: No scleral icterus.       Right eye: No discharge.        Left eye: No discharge.     Conjunctiva/sclera: Conjunctivae normal.  Neck:     Trachea: No tracheal deviation.     Comments: Supple, no meningismus Cardiovascular:     Rate and Rhythm: Normal rate and regular rhythm.  Pulmonary:     Effort: Pulmonary effort is normal. No respiratory distress.     Breath sounds: Normal breath sounds. No stridor. No wheezing or rales.  Abdominal:     General: Bowel sounds are normal. There is no distension.     Palpations: Abdomen is soft.     Tenderness: There is no abdominal tenderness. There is no guarding or rebound.  Musculoskeletal:        General: No tenderness.     Cervical back: Neck supple. No rigidity.  Skin:    General: Skin is warm and dry.     Findings: No rash.  Neurological:  Mental Status: She is alert.     Cranial Nerves: No cranial nerve deficit (no facial droop, extraocular movements intact, no slurred speech).     Sensory: No sensory deficit.     Motor: No abnormal muscle tone or seizure activity.     Coordination: Coordination normal.     ED Results / Procedures / Treatments   Labs (all labs ordered are listed, but only abnormal results are displayed) Labs Reviewed  RESP PANEL BY RT-PCR (FLU A&B, COVID) ARPGX2 - Abnormal; Notable for the following components:      Result Value   Influenza A by PCR POSITIVE (*)    All other components within normal limits  CBC WITH DIFFERENTIAL/PLATELET - Abnormal; Notable for the following components:   Hemoglobin 9.6 (*)    HCT 31.6 (*)    MCV 71.8 (*)    MCH 21.8 (*)    RDW 20.0 (*)    Platelets 406 (*)    Lymphs Abs 0.3 (*)    All other components within normal limits  COMPREHENSIVE METABOLIC PANEL - Abnormal; Notable for the following components:   Potassium 3.3 (*)     Calcium 8.8 (*)    All other components within normal limits  URINALYSIS, ROUTINE W REFLEX MICROSCOPIC - Abnormal; Notable for the following components:   Color, Urine STRAW (*)    Specific Gravity, Urine 1.004 (*)    Hgb urine dipstick SMALL (*)    Ketones, ur 5 (*)    Bacteria, UA RARE (*)    All other components within normal limits  LIPASE, BLOOD  HCG, QUANTITATIVE, PREGNANCY    EKG None  Radiology DG Chest Portable 1 View  Result Date: 03/22/2021 CLINICAL DATA:  Cough and fever EXAM: PORTABLE CHEST 1 VIEW COMPARISON:  Chest x-ray 07/27/2016 FINDINGS: The heart size and mediastinal contours are within normal limits. No focal consolidation. No pulmonary edema. No pleural effusion. No pneumothorax. No acute osseous abnormality. IMPRESSION: No active disease. Electronically Signed   By: Tish Frederickson M.D.   On: 03/22/2021 23:57    Procedures Procedures   Medications Ordered in ED Medications  sodium chloride 0.9 % bolus 1,000 mL (0 mLs Intravenous Stopped 03/22/21 2220)    Followed by  0.9 %  sodium chloride infusion (1,000 mLs Intravenous New Bag/Given 03/22/21 2237)  acetaminophen (TYLENOL) tablet 650 mg (650 mg Oral Given 03/22/21 1902)    ED Course  I have reviewed the triage vital signs and the nursing notes.  Pertinent labs & imaging results that were available during my care of the patient were reviewed by me and considered in my medical decision making (see chart for details).  Clinical Course as of 03/23/21 0005  Mon Mar 22, 2021  2333 Pregnancy test is negative.  Urinalysis is negative. [JK]  2333 CBC is notable for anemia.  Low MCV suggests iron deficiency anemias.  Suspect this is chronic [JK]  2333 Patient's influenza test is positive [JK]  Tue Mar 23, 2021  0000 Chest x-ray negative for acute process [JK]    Clinical Course User Index [JK] Linwood Dibbles, MD   MDM Rules/Calculators/A&P                          Patient presents to the ED for evaluation of  fever body aches chills.  Patient did have a fever in the emergency room.  She was nontoxic-appearing.  No signs of pneumonia.  Urinalysis not signs of urinary tract  infection.  Patient's Influenza test however is positive.  This would certainly account for her symptoms.  Patient was given IV fluids.  Will discharge home with prescription for Tamiflu Final Clinical Impression(s) / ED Diagnoses Final diagnoses:  Influenza    Rx / DC Orders ED Discharge Orders         Ordered    oseltamivir (TAMIFLU) 75 MG capsule  2 times daily        03/23/21 0004           Linwood Dibbles, MD 03/25/21 [phone]

## 2021-03-23 MED ORDER — OSELTAMIVIR PHOSPHATE 75 MG PO CAPS: 75.0000 mg | ORAL_CAPSULE | Freq: Two times a day (BID) | ORAL | 0 refills | Status: DC

## 2021-03-23 NOTE — Discharge Instructions (Signed)
Drink plenty of fluids and rest.  Take Tylenol ibuprofen as needed for fever.  Take the Tamiflu as prescribed

## 2022-02-06 IMAGING — DX DG CHEST 1V PORT
1 series · 1 of 1 positions shown · non-contrast
Comparison: Chest x-ray 07/27/2016

CLINICAL DATA: Cough and fever

EXAM:
PORTABLE CHEST 1 VIEW

[chest ap]
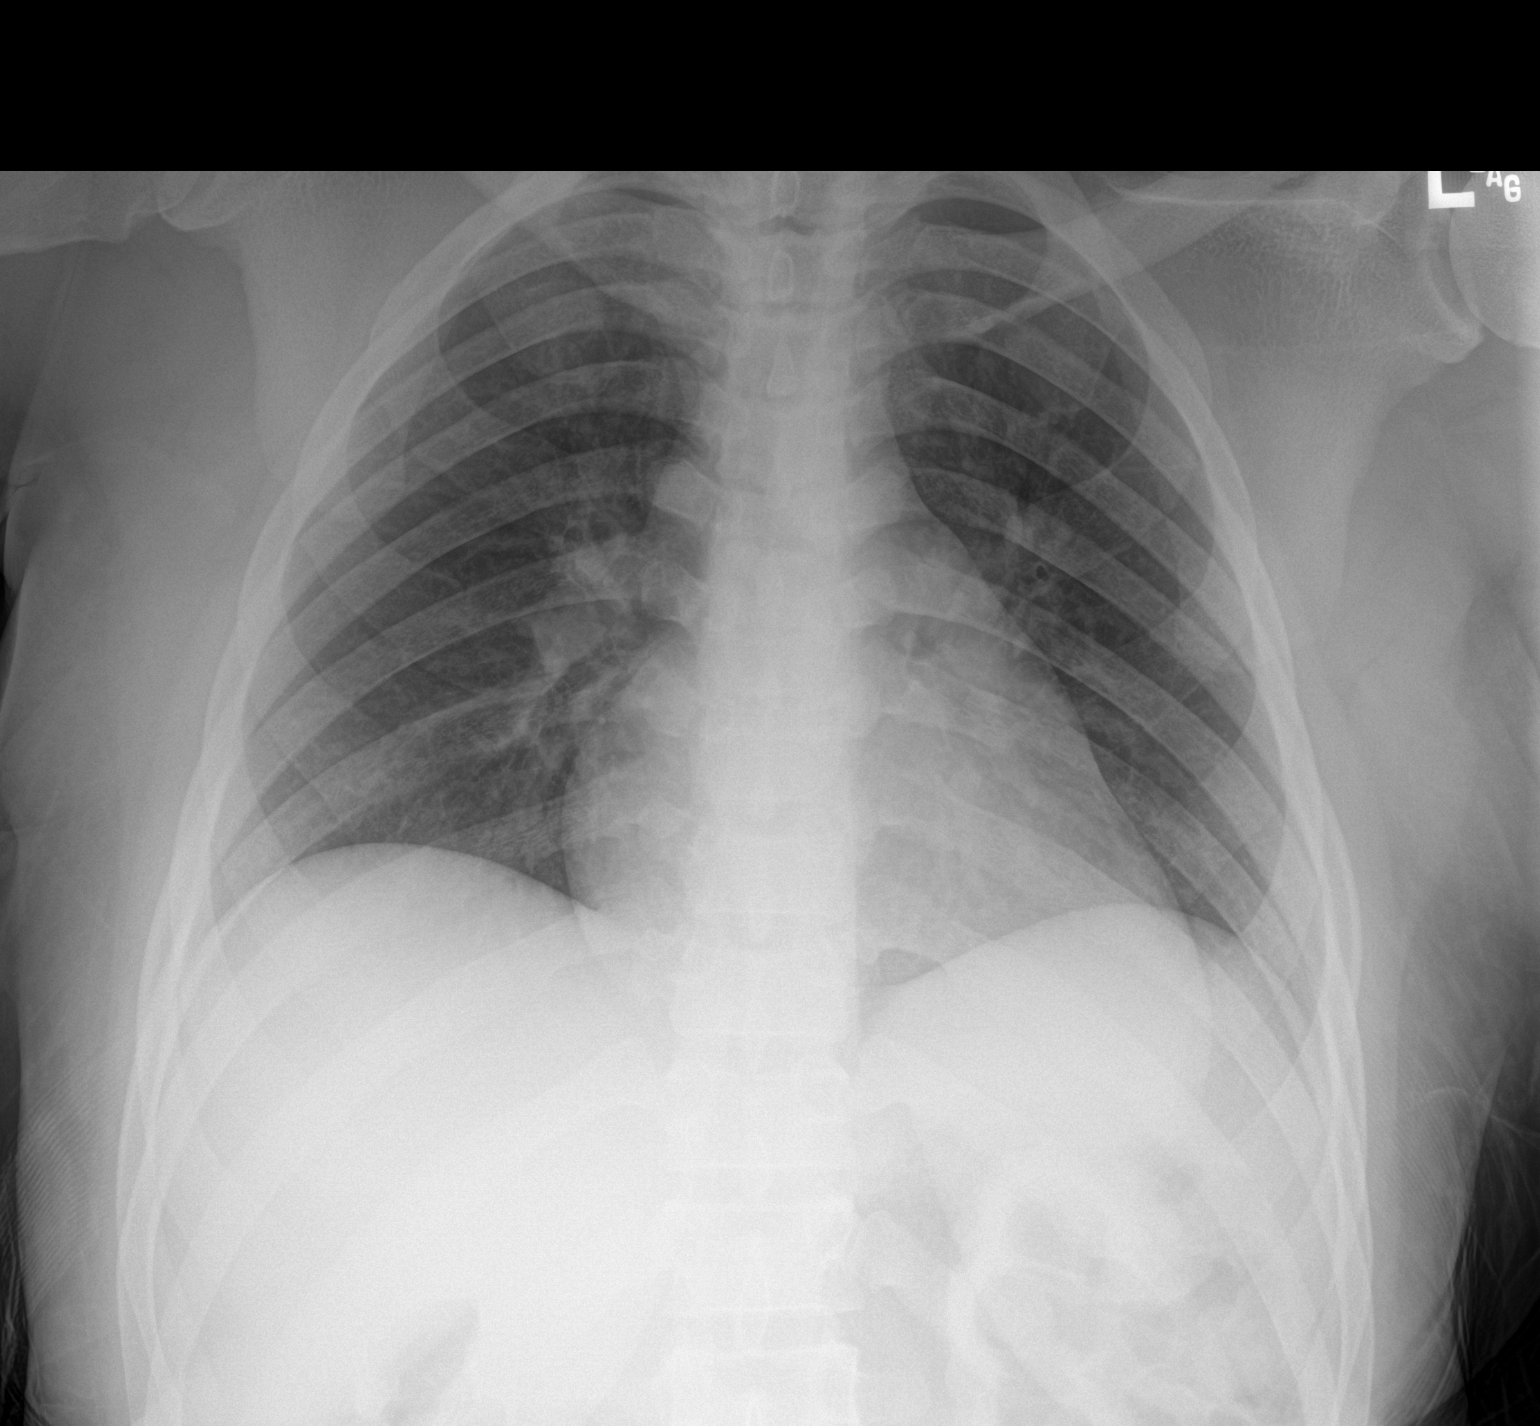

[1 of 1 positions shown; findings below may reference images not displayed]

FINDINGS: The heart size and mediastinal contours are within normal limits.

No focal consolidation. No pulmonary edema. No pleural effusion. No
pneumothorax.

No acute osseous abnormality.
IMPRESSION: No active disease.

## 2023-05-04 ENCOUNTER — Encounter: Payer: 59 | Admitting: Radiology

## 2023-05-09 ENCOUNTER — Ambulatory Visit (INDEPENDENT_AMBULATORY_CARE_PROVIDER_SITE_OTHER): Payer: 59 | Admitting: Radiology

## 2023-05-09 ENCOUNTER — Other Ambulatory Visit (HOSPITAL_COMMUNITY)
Admission: RE | Admit: 2023-05-09 | Discharge: 2023-05-09 | Disposition: A | Discharge status: Home / Self Care | Payer: 59 | Source: Ambulatory Visit | Attending: Radiology | Admitting: Radiology

## 2023-05-09 ENCOUNTER — Encounter: Payer: Self-pay | Admitting: Radiology

## 2023-05-09 VITALS — BP 102/68 | Ht 59.5 in | Wt 179.0 lb

## 2023-05-09 DIAGNOSIS — Z01419 Encounter for gynecological examination (general) (routine) without abnormal findings: Secondary | ICD-10-CM | POA: Diagnosis present

## 2023-05-09 DIAGNOSIS — Z113 Encounter for screening for infections with a predominantly sexual mode of transmission: Secondary | ICD-10-CM

## 2023-05-09 NOTE — Progress Notes (Signed)
   CESAR MUSACCHIA 08-05-1991 [id number]   History:  32 y.o. G0 presents for annual exam as a  new patient. C/o some tightness in her left axillae and increased vaginal fluids.Most recent period was 6 days late. No other gyn concerns.   Gynecologic History Patient's last menstrual period was 05/01/2023 (exact date). Period Duration (Days): 5 Period Pattern: (!) Irregular Menstrual Flow: Light, Moderate, Heavy Menstrual Control: Maxi pad, Thin pad Dysmenorrhea: (!) Moderate (moderate to severe) Dysmenorrhea Symptoms: Cramping Contraception/Family planning:  female partner Sexually active: yes Last Pap: 2020. Results were: normal   Obstetric History OB History  Gravida Para Term Preterm AB Living  0 0 0 0 0 0  SAB IAB Ectopic Multiple Live Births  0 0 0 0 0     The following portions of the patient's history were reviewed and updated as appropriate: allergies, current medications, past family history, past medical history, past social history, past surgical history, and problem list.  Review of Systems Pertinent items noted in HPI and remainder of comprehensive ROS otherwise negative.   Past medical history, past surgical history, family history and social history were all reviewed and documented in the EPIC chart.   Exam:  Vitals:   05/09/23 1408  BP: 102/68  Weight: 179 lb (81.2 kg)  Height: 4' 11.5" (1.511 m)   Body mass index is 35.55 kg/m.  General appearance:  Normal Thyroid:  Symmetrical, normal in size, without palpable masses or nodularity. Respiratory  Auscultation:  Clear without wheezing or rhonchi Cardiovascular  Auscultation:  Regular rate, without rubs, murmurs or gallops  Edema/varicosities:  Not grossly evident Abdominal  Soft,nontender, without masses, guarding or rebound.  Liver/spleen:  No organomegaly noted  Hernia:  None appreciated  Skin  Inspection:  Grossly normal Breasts: Examined lying and sitting.   Right: Without masses,  retractions, nipple discharge or axillary adenopathy.   Left: Without masses, retractions, nipple discharge or axillary adenopathy. Genitourinary   Inguinal/mons:  Normal without inguinal adenopathy  External genitalia:  Normal appearing vulva with no masses, tenderness, or lesions  BUS/Urethra/Skene's glands:  Normal without masses or exudate  Vagina:  Normal appearing with normal color and discharge, no lesions  Cervix:  Normal appearing without discharge or lesions  Uterus:  Normal in size, shape and contour.  Mobile, nontender  Adnexa/parametria:     Rt: Normal in size, without masses or tenderness.   Lt: Normal in size, without masses or tenderness.  Anus and perineum: Normal   Raynelle Fanning, CMA present for exam  Assessment/Plan:   1. Well woman exam with routine gynecological exam - Cytology - PAP( [place])  2. Screening for STDs (sexually transmitted diseases) - Cytology - PAP( [place])     Discussed SBE, colonoscopy and DEXA screening as directed/appropriate. Recommend of exercise weekly, including weight bearing exercise. Encouraged the use of seatbelts and sunscreen. Return in 1 year for annual or as needed.   Arlie Solomons B WHNP-BC 2:24 PM 05/09/2023

## 2023-05-18 LAB — CYTOLOGY - PAP
Chlamydia: NEGATIVE
Comment: NEGATIVE
Comment: NEGATIVE
Comment: NORMAL
Diagnosis: NEGATIVE
Neisseria Gonorrhea: NEGATIVE
Trichomonas: NEGATIVE

## 2023-12-29 ENCOUNTER — Ambulatory Visit (HOSPITAL_COMMUNITY)
Admission: EM | Admit: 2023-12-29 | Discharge: 2023-12-29 | Disposition: A | Discharge status: Home / Self Care | Payer: 59 | Attending: Emergency Medicine | Admitting: Emergency Medicine

## 2023-12-29 ENCOUNTER — Encounter (HOSPITAL_COMMUNITY): Payer: Self-pay | Admitting: Emergency Medicine

## 2023-12-29 DIAGNOSIS — K0500 Acute gingivitis, plaque induced: Secondary | ICD-10-CM | POA: Insufficient documentation

## 2023-12-29 LAB — CBC WITH DIFFERENTIAL/PLATELET
Abs Immature Granulocytes: 0.03 10*3/uL (ref 0.00–0.07)
Basophils Absolute: 0.1 10*3/uL (ref 0.0–0.1)
Basophils Relative: 1 %
Eosinophils Absolute: 0.2 10*3/uL (ref 0.0–0.5)
Eosinophils Relative: 3 %
HCT: 34.3 % — ABNORMAL LOW (ref 36.0–46.0)
Hemoglobin: 10.2 g/dL — ABNORMAL LOW (ref 12.0–15.0)
Immature Granulocytes: 0 %
Lymphocytes Relative: 22 %
Lymphs Abs: 2.1 10*3/uL (ref 0.7–4.0)
MCH: 22.1 pg — ABNORMAL LOW (ref 26.0–34.0)
MCHC: 29.7 g/dL — ABNORMAL LOW (ref 30.0–36.0)
MCV: 74.4 fL — ABNORMAL LOW (ref 80.0–100.0)
Monocytes Absolute: 1 10*3/uL (ref 0.1–1.0)
Monocytes Relative: 11 %
Neutro Abs: 5.9 10*3/uL (ref 1.7–7.7)
Neutrophils Relative %: 63 %
Platelets: 494 10*3/uL — ABNORMAL HIGH (ref 150–400)
RBC: 4.61 MIL/uL (ref 3.87–5.11)
RDW: 19.9 % — ABNORMAL HIGH (ref 11.5–15.5)
WBC: 9.3 10*3/uL (ref 4.0–10.5)
nRBC: 0 % (ref 0.0–0.2)

## 2023-12-29 LAB — IRON AND TIBC
Iron: 22 ug/dL — ABNORMAL LOW (ref 28–170)
Saturation Ratios: 4 % — ABNORMAL LOW (ref 10.4–31.8)
TIBC: 522 ug/dL — ABNORMAL HIGH (ref 250–450)
UIBC: 500 ug/dL

## 2023-12-29 LAB — VITAMIN B12: Vitamin B-12: 176 pg/mL — ABNORMAL LOW (ref 180–914)

## 2023-12-29 MED ORDER — LIDOCAINE VISCOUS HCL 2 % MT SOLN: 15.0000 mL | Freq: Four times a day (QID) | OROMUCOSAL | 0 refills | Status: AC | PRN

## 2023-12-29 MED ORDER — CLOTRIMAZOLE 10 MG MT TROC: 10.0000 mg | Freq: Every day | OROMUCOSAL | 0 refills | Status: AC

## 2023-12-29 NOTE — Discharge Instructions (Signed)
I have enclosed some information about gingivitis that I hope you find helpful.  Today we did blood work to check your red blood cell counts, iron level, and vitamin B12 level, all of which are indicators of anemia.  We will notify you of those results once we receive them and provide you with further recommendations, if any are needed.  For relief of your discomfort of your lower gums, I provided you with an oral solution that contains lidocaine that you can use every 6 hours as needed.  Swish it in your mouth for about 30 seconds with each use.  In addition to pain relief, I also recommend that you use clotrimazole oral solution 5 times daily to clear up any yeast that may be contributing to your discomfort as well.  Yeast is opportunistic and when you are severely anemic, sometimes you can develop yeast infection in your gums.  Please be sure you follow-up with your primary care provider regarding this visit and please consider working on finding a dentist for further evaluation of your gum disease.  Thank you for visiting [facility] today.

## 2023-12-29 NOTE — ED Provider Notes (Signed)
[facility]    CSN: [id number] Arrival date & time: 12/29/23  1719    HISTORY   Chief Complaint  Patient presents with   Dental Pain   HPI Karina Gray is a pleasant, 33 y.o. female who presents to urgent care today. Patient complains of a 2-day history of pain of her front lower gingiva.  Patient denies injury, sores or lesion in the area.  Patient states that the area is red and uncomfortable.  Patient states she has tried brushing her teeth aggressively, swishing liquor, several different mouthwashes and warm salt water with no relief.  Patient reports a history of anemia, not currently taking any iron supplements for this.  Patient states she does not currently have a primary care provider or dentist.  The history is provided by the patient.   History reviewed. No pertinent past medical history. There are no active problems to display for this patient.  Past Surgical History:  Procedure Laterality Date   KNEE SURGERY Bilateral 2006   OB History     Gravida  0   Para  0   Term  0   Preterm  0   AB  0   Living  0      SAB  0   IAB  0   Ectopic  0   Multiple  0   Live Births  0          Home Medications    Prior to Admission medications   Not on File    Family History Family History  Problem Relation Age of Onset   Heart attack Mother    Diabetes Father    Social History Social History   Tobacco Use   Smoking status: Former    Types: Cigarettes   Smokeless tobacco: Never  Vaping Use   Vaping status: Never Used  Substance Use Topics   Alcohol use: Yes    Comment: rare   Drug use: Yes    Types: Marijuana   Allergies   Patient has no known allergies.  Review of Systems Review of Systems Pertinent findings revealed after performing a 14 point review of systems has been noted in the history of present illness.  Physical Exam Vital Signs BP 122/85 (BP Location: Right Arm)   Pulse 63   Temp 98.6 F (37 C)  (Oral)   Resp 14   LMP 12/02/2023 (Approximate)   SpO2 100%   No data found.  Physical Exam Vitals and nursing note reviewed.  Constitutional:      General: She is not in acute distress.    Appearance: Normal appearance.  HENT:     Head: Normocephalic and atraumatic.     Mouth/Throat:     Dentition: Gingival swelling (Linear erythema) present. No dental caries or dental abscesses.  Eyes:     Pupils: Pupils are equal, round, and reactive to light.  Cardiovascular:     Rate and Rhythm: Normal rate and regular rhythm.  Pulmonary:     Effort: Pulmonary effort is normal.     Breath sounds: Normal breath sounds.  Musculoskeletal:        General: Normal range of motion.     Cervical back: Normal range of motion and neck supple.  Skin:    General: Skin is warm and dry.  Neurological:     General: No focal deficit present.     Mental Status: She is alert and oriented to person, place, and time. Mental status is at  baseline.  Psychiatric:        Mood and Affect: Mood normal.        Behavior: Behavior normal.        Thought Content: Thought content normal.        Judgment: Judgment normal.     Visual Acuity Right Eye Distance:   Left Eye Distance:   Bilateral Distance:    Right Eye Near:   Left Eye Near:    Bilateral Near:     UC Couse / Diagnostics / Procedures:     Radiology No results found.  Procedures Procedures (including critical care time) EKG  Pending results:  Labs Reviewed  CBC WITH DIFFERENTIAL/PLATELET  IRON AND TIBC  VITAMIN B12    Medications Ordered in UC: Medications - No data to display  UC Diagnoses / Final Clinical Impressions(s)   I have reviewed the triage vital signs and the nursing notes.  Pertinent labs & imaging results that were available during my care of the patient were reviewed by me and considered in my medical decision making (see chart for details).    Final diagnoses:  Acute gingivitis   Blood work was obtained to  evaluate patient's anemia, iron level and B12 level.  Will provide patient with results once received and provide recommendations as needed.  For pain relief, patient provided with viscous lidocaine.  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  Conservative care recommended.  Return precautions advised.  Please see discharge instructions below for details of plan of care as provided to patient. ED Prescriptions     Medication Sig Dispense Auth. Provider   clotrimazole (MYCELEX) 10 MG troche Take 1 tablet (10 mg total) by mouth 5 (five) times daily for 14 days. 70 tablet Theadora Rama Scales, PA-C   lidocaine (XYLOCAINE) 2 % solution Use as directed 15 mLs in the mouth or throat every 6 (six) hours as needed for up to 14 days for mouth pain (Sore throat). 840 mL Theadora Rama Scales, PA-C      PDMP not reviewed this encounter.  Pending results:  Labs Reviewed  CBC WITH DIFFERENTIAL/PLATELET  IRON AND TIBC  VITAMIN B12      Discharge Instructions      I have enclosed some information about gingivitis that I hope you find helpful.  Today we did blood work to check your red blood cell counts, iron level, and vitamin B12 level, all of which are indicators of anemia.  We will notify you of those results once we receive them and provide you with further recommendations, if any are needed.  For relief of your discomfort of your lower gums, I provided you with an oral solution that contains lidocaine that you can use every 6 hours as needed.  Swish it in your mouth for about 30 seconds with each use.  In addition to pain relief, I also recommend that you use clotrimazole oral solution 5 times daily to clear up any yeast that may be contributing to your discomfort as well.  Yeast is opportunistic and  when you are severely anemic, sometimes you can develop yeast infection in your gums.  Please be sure you follow-up with your primary care provider regarding this visit and please consider working on finding a dentist for further evaluation of your gum disease.  Thank you for visiting [facility] today.        Disposition Upon Discharge:  Condition: stable for discharge home  Patient presented with an acute illness with associated systemic symptoms and significant discomfort requiring urgent management. In my opinion, this is a condition that a prudent lay person (someone who possesses an average knowledge of health and medicine) may potentially expect to result in complications if not addressed urgently such as respiratory distress, impairment of bodily function or dysfunction of bodily organs.   Routine symptom specific, illness specific and/or disease specific instructions were discussed with the patient and/or caregiver at length.   As such, the patient has been evaluated and assessed, work-up was performed and treatment was provided in alignment with urgent care protocols and evidence based medicine.  Patient/parent/caregiver has been advised that the patient may require follow up for further testing and treatment if the symptoms continue in spite of treatment, as clinically indicated and appropriate.  Patient/parent/caregiver has been advised to return to the [facility] or PCP if no better; to PCP or the Emergency Department if new signs and symptoms develop, or if the current signs or symptoms continue to change or worsen for further workup, evaluation and treatment as clinically indicated and appropriate  The patient will follow up with their current PCP if and as advised. If the patient does not currently have a PCP we will assist them in obtaining one.   The patient may need specialty follow up if the symptoms continue, in spite of conservative treatment and management, for  further workup, evaluation, consultation and treatment as clinically indicated and appropriate.  Patient/parent/caregiver verbalized understanding and agreement of plan as discussed.  All questions were addressed during visit.  Please see discharge instructions below for further details of plan.  This office note has been dictated using Teaching laboratory technician.  Unfortunately, this method of dictation can sometimes lead to typographical or grammatical errors.  I apologize for your inconvenience in advance if this occurs.  Please do not hesitate to reach out to me if clarification is needed.      Theadora Rama Scales, [place] 12/29/23 [phone]

## 2023-12-29 NOTE — ED Triage Notes (Signed)
Pt c/o mouth pain for 2 days. Denies any injury, sores or lesions.
